# Patient Record
Sex: Male | Born: 1960 | Race: White | Hispanic: No | Marital: Married | Smoking: Never smoker
Health system: Southern US, Community
[De-identification: ages and names within clinical notes are randomized; demographics above are authoritative.]

## PROBLEM LIST (undated history)

## (undated) DIAGNOSIS — I10 Essential (primary) hypertension: Secondary | ICD-10-CM

## (undated) DIAGNOSIS — E782 Mixed hyperlipidemia: Secondary | ICD-10-CM

## (undated) DIAGNOSIS — E119 Type 2 diabetes mellitus without complications: Secondary | ICD-10-CM

## (undated) HISTORY — DX: Essential (primary) hypertension: I10

## (undated) HISTORY — PX: KNEE SURGERY: SHX244

## (undated) HISTORY — DX: Type 2 diabetes mellitus without complications: E11.9

## (undated) HISTORY — DX: Mixed hyperlipidemia: E78.2

## (undated) HISTORY — PX: EYE SURGERY: SHX253

---

## 2005-06-05 ENCOUNTER — Encounter: Admission: RE | Admit: 2005-06-05 | Discharge: 2005-06-05 | Payer: Self-pay | Admitting: Internal Medicine

## 2005-09-28 ENCOUNTER — Emergency Department (HOSPITAL_COMMUNITY): Admission: EM | Admit: 2005-09-28 | Discharge: 2005-09-29 | Payer: Self-pay | Admitting: Emergency Medicine

## 2007-09-20 IMAGING — CR DG ORBITS FOR FOREIGN BODY
2 series · 2 of 2 positions shown · non-contrast
Comparison: none

CLINICAL DATA: Pre MRI/metal exposure
 ORBITS FOR FOREIGN BODY - 2 VIEW:

[view not recorded (1 of 2)]
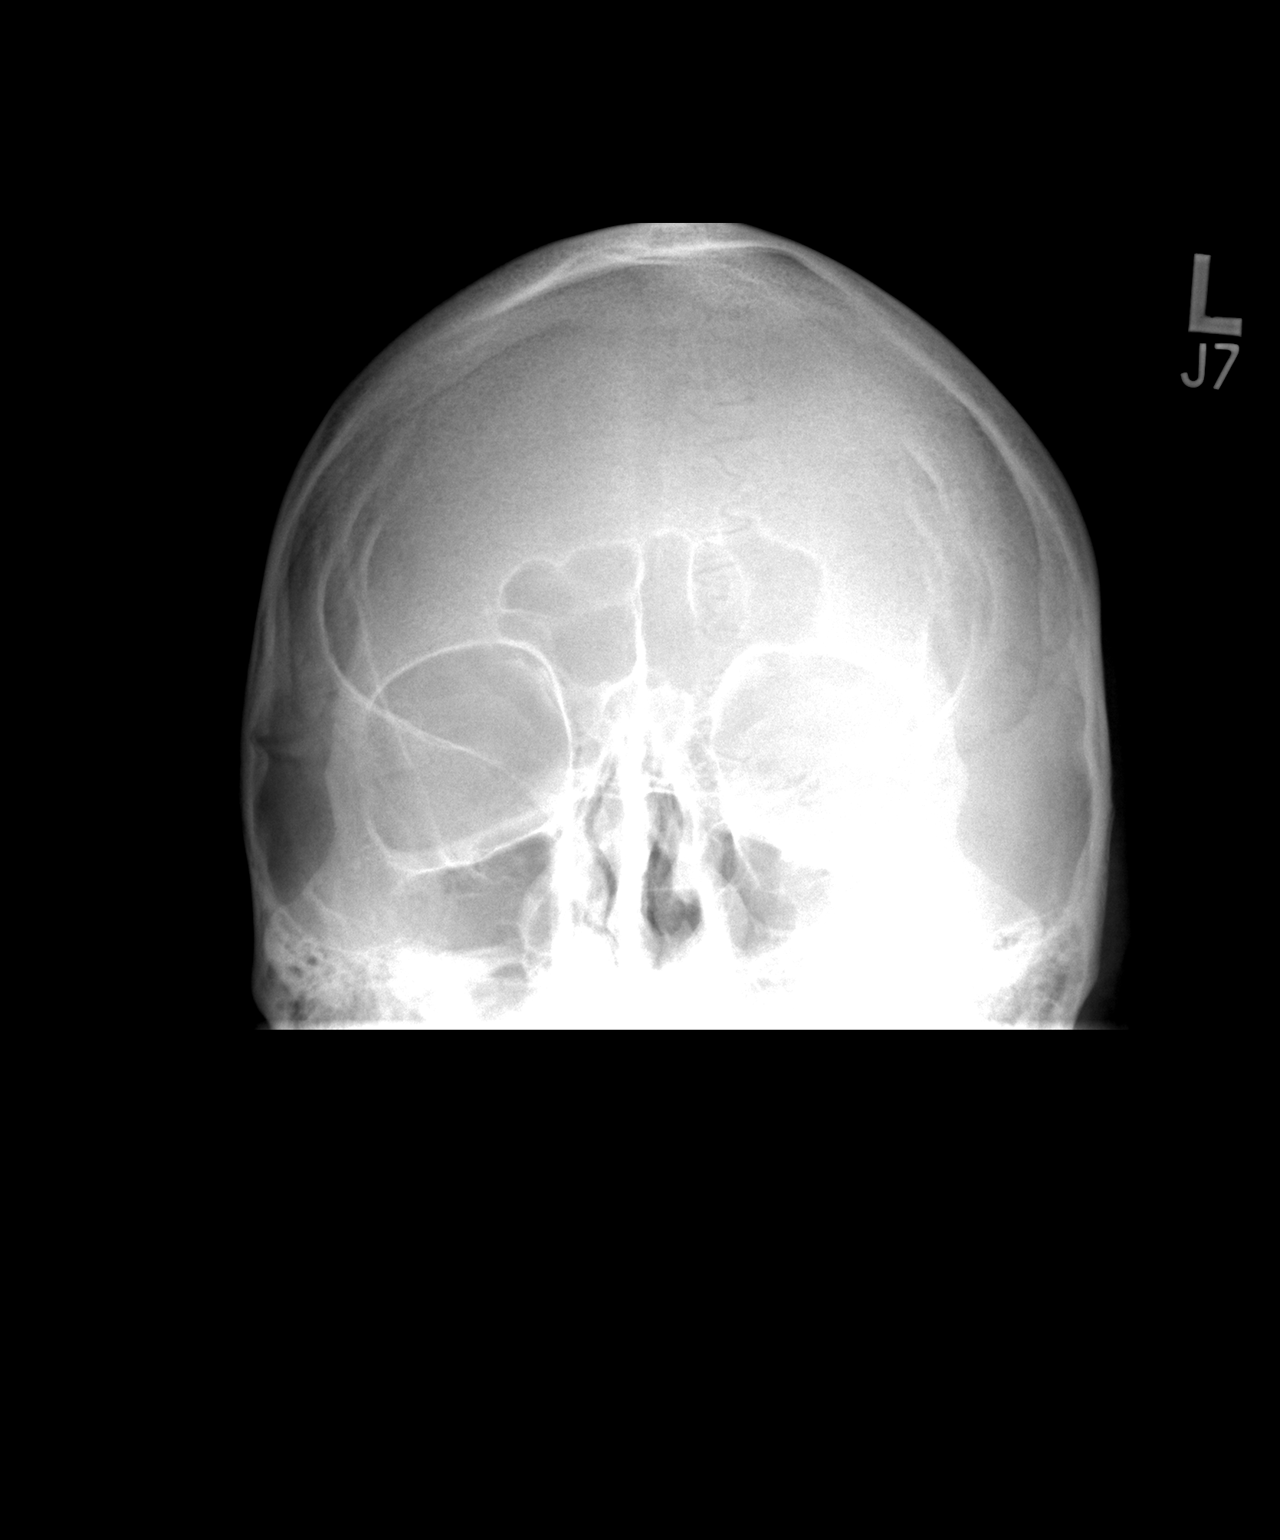

[view not recorded (2 of 2)]
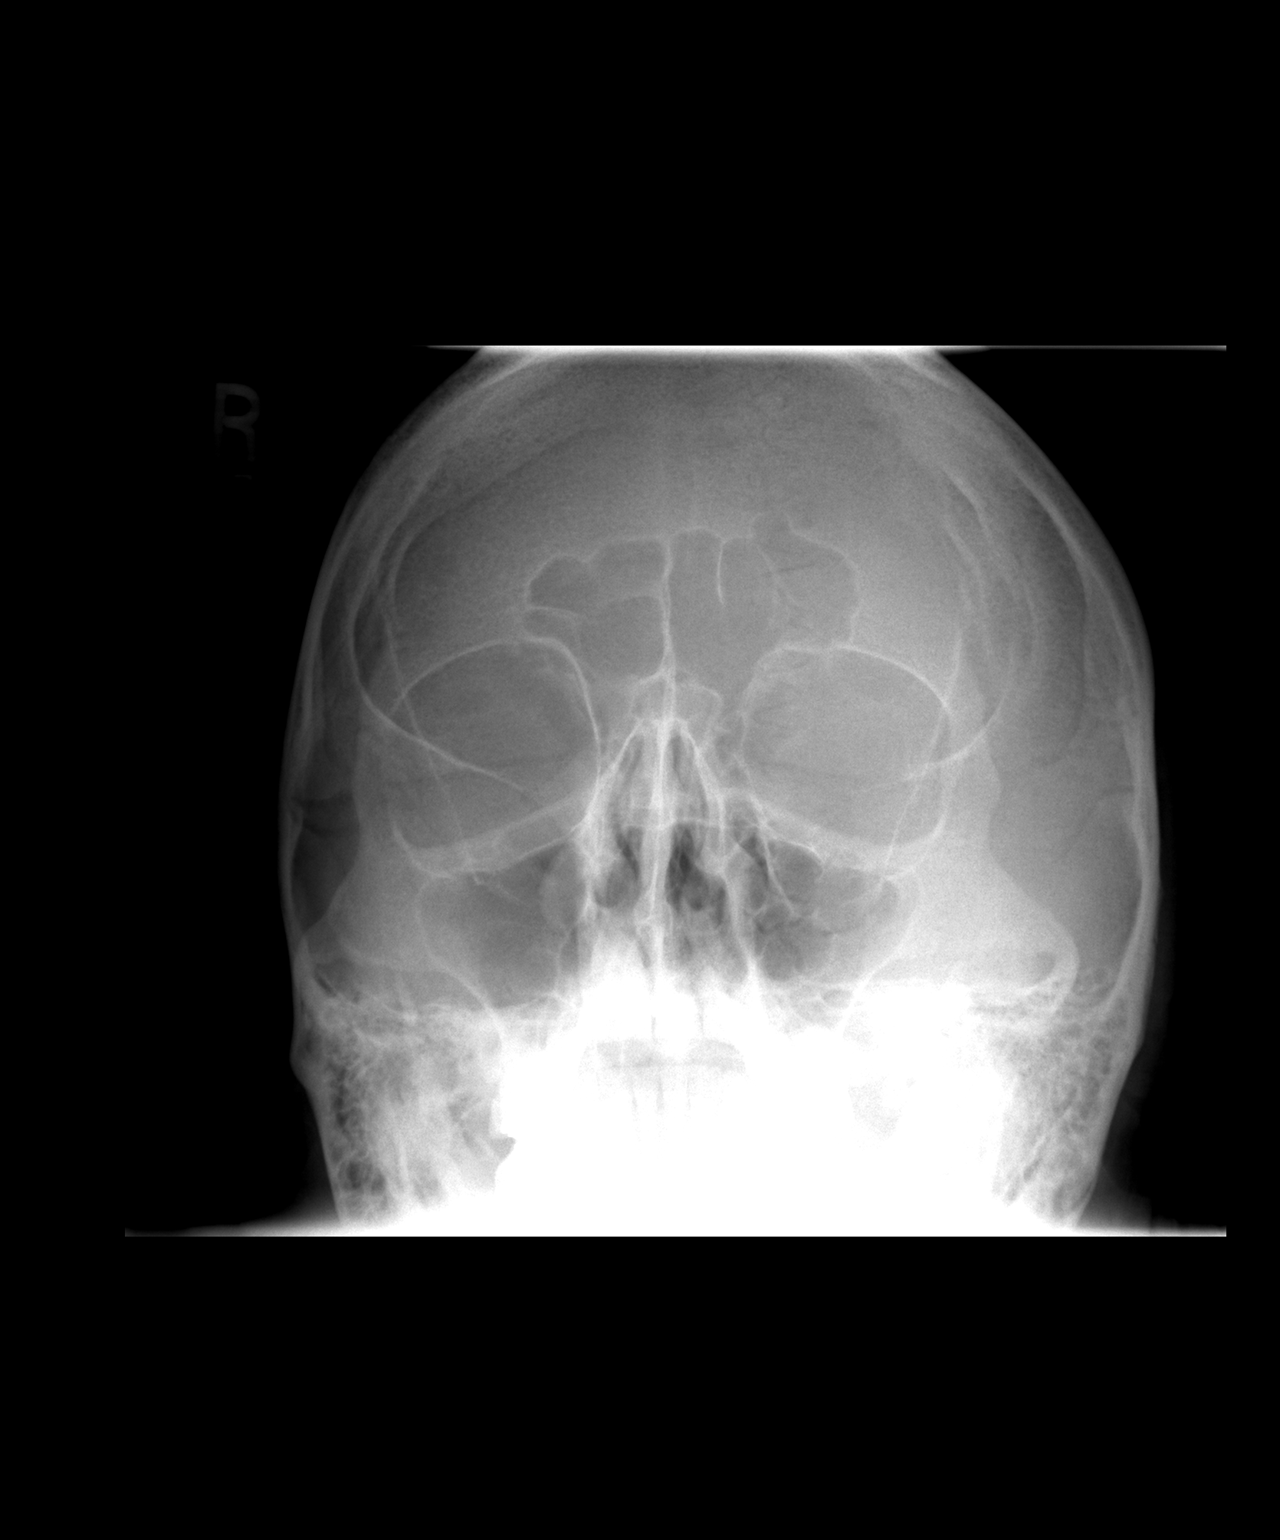

[2 of 2 positions shown; findings below may reference images not displayed]

FINDINGS: There is no evidence of metal or radiopaque foreign body within the orbits.   No significant bone or soft tissue abnormalities are identified.
IMPRESSION: No evidence of metallic foreign body.

## 2022-06-02 ENCOUNTER — Ambulatory Visit: Payer: No Typology Code available for payment source | Admitting: Cardiology

## 2022-06-02 ENCOUNTER — Encounter: Payer: Self-pay | Admitting: Cardiology

## 2022-06-02 VITALS — BP 150/88 | HR 74 | Resp 16 | Ht 70.0 in | Wt 261.0 lb

## 2022-06-02 DIAGNOSIS — E782 Mixed hyperlipidemia: Secondary | ICD-10-CM | POA: Insufficient documentation

## 2022-06-02 DIAGNOSIS — I2510 Atherosclerotic heart disease of native coronary artery without angina pectoris: Secondary | ICD-10-CM | POA: Insufficient documentation

## 2022-06-02 DIAGNOSIS — I10 Essential (primary) hypertension: Secondary | ICD-10-CM

## 2022-06-02 DIAGNOSIS — M7989 Other specified soft tissue disorders: Secondary | ICD-10-CM | POA: Insufficient documentation

## 2022-06-02 MED ORDER — ROSUVASTATIN CALCIUM 20 MG PO TABS: 20.0000 mg | ORAL_TABLET | Freq: Every day | ORAL | 3 refills | Status: DC

## 2022-06-02 NOTE — Progress Notes (Addendum)
Patient referred by Johny Blamer, MD for CAD screening Subjective:   Dustin Sanchez, male    DOB: [birth year], 61 y.o.   MRN: [id number]   Chief Complaint  Patient presents with   Coronary Artery Disease   Hypertension   Hyperlipidemia   New Patient (Initial Visit)     HPI  62 y.o. Caucasian male with hypertension, hyperlipidemia, type 2 DM  Patient recently moved from [facility] to [place].  He states that he "hibernate's" and winter months, and spends a lot of time on importance of maintenance.  He does not do any regular walking.  With his level of physical activity, he denies any chest pain or shortness of breath symptoms.  He tells me that several years ago, he had an episode of chest pain while in [place] that led to urgent care and then ER visit.  Subsequently, he underwent coronary angiography.  He tells me that they did find some "occlusion", but medical management was recommended.  Patient does not smoke.  He drinks 2-3 alcoholic drinks 2-3 times/week. Reviewed recent test results with the patient, details below.   He has no neuropathy.  He has noticed swelling in his right leg for last 2 to 3 months.    Past Medical History:  Diagnosis Date   Diabetes mellitus    Hypertension    Mixed hyperlipidemia      Past Surgical History:  Procedure Laterality Date   EYE SURGERY     KNEE SURGERY       Social History   Tobacco Use  Smoking Status Not on file  Smokeless Tobacco Not on file    Social History   Substance and Sexual Activity  Alcohol Use Not on file     Family History  Adopted: Yes      Current Outpatient Medications:    Alpha Lipoic Acid 200 MG CAPS, Take by mouth., Disp: , Rfl:    amLODipine (NORVASC) 10 MG tablet, Take 10 mg by mouth daily., Disp: , Rfl:    aspirin 81 MG chewable tablet, Chew 81 mg by mouth daily., Disp: , Rfl:    chlorthalidone (HYGROTON) 25 MG tablet, Take 25 mg by mouth daily., Disp: , Rfl:    cloNIDine  (CATAPRES) 0.1 MG tablet, Take 0.1 mg by mouth 2 (two) times daily., Disp: , Rfl:    cloNIDine (CATAPRES) 0.2 MG tablet, Take 0.2 mg by mouth 2 (two) times daily., Disp: , Rfl:    insulin glargine, 2 Unit Dial, (TOUJEO MAX SOLOSTAR) 300 UNIT/ML Solostar Pen, 190 units Subcutaneous once a day, Disp: , Rfl:    metoprolol succinate (TOPROL-XL) 100 MG 24 hr tablet, Take 1 tablet by mouth 2 (two) times daily., Disp: , Rfl:    ramipril (ALTACE) 10 MG capsule, Take 10 mg by mouth 2 (two) times daily., Disp: , Rfl:    rosuvastatin (CRESTOR) 10 MG tablet, Take 10 mg by mouth daily., Disp: , Rfl:    spironolactone (ALDACTONE) 50 MG tablet, Take 50 mg by mouth daily., Disp: , Rfl:    SYNJARDY XR 12.06-998 MG TB24, Take by mouth., Disp: , Rfl:    tadalafil (CIALIS) 20 MG tablet, Take 20 mg by mouth daily as needed., Disp: , Rfl:    Cardiovascular and other pertinent studies:  Reviewed external labs and tests, independently interpreted  EKG 06/02/2022: Sinus rhythm 71 bpm Normal EKG   Echocardiogram 2019: Technically difficult study due to chest Maue/lung interference.   Concentric left ventricular  remodeling with normal systolic function (EF  >55%) and grade III diastolic dysfunction suggesting significantly  elevated filling pressures.   No significant structural valvular abnormalities.   Moderately dilated left atrium.   Normal right ventricular systolic function.   Mildly dilated right atrium with elevated right atrial pressure.    Recent labs: 06/01/2022: HbA1C 9.4%  08/2021: Glucose 134, BUN/Cr 28/1.26. EGFR 65. Na/K 137/4.7. Rest of the CMP normal H/H 15/46. MCV 92. Platelets 186 HbA1C 9.8% Chol 87, TG 300, HDL 28, LDL calculated 16    Review of Systems  Cardiovascular:  Negative for chest pain, dyspnea on exertion, leg swelling, palpitations and syncope.         Vitals:   06/02/22 1053 06/02/22 1056  BP: (!) 156/88 (!) 150/88  Pulse: 74 74  Resp: 16   SpO2: 96%       Body mass index is 37.45 kg/m. Filed Weights   06/02/22 1053  Weight: 261 lb (118.4 kg)     Objective:   Physical Exam Vitals and nursing note reviewed.  Constitutional:      General: He is not in acute distress.    Appearance: He is obese.  Neck:     Vascular: No JVD.  Cardiovascular:     Rate and Rhythm: Normal rate and regular rhythm.     Heart sounds: Normal heart sounds. No murmur heard. Pulmonary:     Effort: Pulmonary effort is normal.     Breath sounds: Normal breath sounds. No wheezing or rales.  Musculoskeletal:        General: Swelling (Right leg) present.          Visit diagnoses:   ICD-10-CM   1. Essential hypertension  I10 EKG 12-Lead    PCV MYOCARDIAL PERFUSION WO LEXISCAN    2. Mixed hyperlipidemia  E78.2 EKG 12-Lead    PCV MYOCARDIAL PERFUSION WO LEXISCAN    Lipid panel    Direct LDL    Direct LDL    Lipid panel    3. Primary hypertension  I10 PCV ECHOCARDIOGRAM COMPLETE    4. Right leg swelling  M79.89 PCV LOWER VENOUS US (UNILATERAL)    5. Coronary artery disease involving native coronary artery of native heart without angina pectoris  I25.10 PCV MYOCARDIAL PERFUSION WO LEXISCAN       Orders Placed This Encounter  Procedures   Lipid panel   Direct LDL   PCV MYOCARDIAL PERFUSION WO LEXISCAN   EKG 12-Lead   PCV ECHOCARDIOGRAM COMPLETE   PCV LOWER VENOUS US (UNILATERAL)     Medication changes this visit: Medications Discontinued During This Encounter  Medication Reason   atorvastatin (LIPITOR) 10 MG tablet    rosuvastatin (CRESTOR) 10 MG tablet Reorder    Meds ordered this encounter  Medications   rosuvastatin (CRESTOR) 20 MG tablet    Sig: Take 1 tablet (20 mg total) by mouth daily.    Dispense:  90 tablet    Refill:  3     Assessment & Recommendations:   62 y.o. Caucasian male with hypertension, hyperlipidemia, type 2 DM  CAD screening: Known CAD. No angina symptoms. Encourage increase physical activity,  especially as his knee pain is now better.  Prior to starting exercise regimen, recommend exercise nuclear stress test and echocardiogram for cardiac risk stratification. Continue Aspirin, statin, Increase Crestor to 20 mg daily.  Hypertension: Uncontrolled.  Already on multiple agents.  I encouraged him to check home readings regularly.  If remains uncontrolled, may need to increase  dose of clonidine. Will check renal artery duplex.  Mixed hyperlipidemia: TG 300 in the setting of A1c 9.8%.  LDL likely falsely low. Discussed reducing alcohol intake and better diabetes control. Continue Aspirin, statin, Increase Crestor to 20 mg daily. Repeat fasting lipid panel and direct LDL in 3 months.  Right leg swelling: Check DVT study.   Thank you for referring the patient to [place]. Please feel free to contact with any questions.   Elder Negus, MD Pager: [phone] Office: [phone]

## 2022-06-09 LAB — LDL CHOLESTEROL, DIRECT: LDL Direct: 51 mg/dL (ref 0–99)

## 2022-06-09 LAB — LIPID PANEL
Chol/HDL Ratio: 3.2 ratio (ref 0.0–5.0)
Cholesterol, Total: 116 mg/dL (ref 100–199)
HDL: 36 mg/dL — ABNORMAL LOW (ref >39)
LDL Chol Calc (NIH): 45 mg/dL (ref 0–99)
Triglycerides: 216 mg/dL — ABNORMAL HIGH (ref 0–149)
VLDL Cholesterol Cal: 35 mg/dL (ref 5–40)

## 2022-06-19 ENCOUNTER — Ambulatory Visit: Payer: No Typology Code available for payment source

## 2022-06-19 DIAGNOSIS — E782 Mixed hyperlipidemia: Secondary | ICD-10-CM

## 2022-06-19 DIAGNOSIS — I2510 Atherosclerotic heart disease of native coronary artery without angina pectoris: Secondary | ICD-10-CM

## 2022-06-19 DIAGNOSIS — I10 Essential (primary) hypertension: Secondary | ICD-10-CM

## 2022-06-28 LAB — PCV MYOCARDIAL PERFUSION WO LEXISCAN: ST Depression (mm): 0 mm

## 2022-06-30 ENCOUNTER — Ambulatory Visit: Payer: No Typology Code available for payment source

## 2022-06-30 DIAGNOSIS — I10 Essential (primary) hypertension: Secondary | ICD-10-CM

## 2022-07-05 ENCOUNTER — Ambulatory Visit: Payer: No Typology Code available for payment source

## 2022-07-05 DIAGNOSIS — M7989 Other specified soft tissue disorders: Secondary | ICD-10-CM

## 2022-09-06 ENCOUNTER — Ambulatory Visit: Payer: No Typology Code available for payment source | Admitting: Cardiology

## 2022-09-08 ENCOUNTER — Ambulatory Visit: Payer: No Typology Code available for payment source | Admitting: Cardiology

## 2022-09-08 ENCOUNTER — Encounter: Payer: Self-pay | Admitting: Cardiology

## 2022-09-08 VITALS — BP 126/80 | HR 71 | Resp 16 | Ht 70.0 in | Wt 263.6 lb

## 2022-09-08 DIAGNOSIS — I77810 Thoracic aortic ectasia: Secondary | ICD-10-CM | POA: Insufficient documentation

## 2022-09-08 DIAGNOSIS — E782 Mixed hyperlipidemia: Secondary | ICD-10-CM

## 2022-09-08 DIAGNOSIS — I10 Essential (primary) hypertension: Secondary | ICD-10-CM

## 2022-09-08 NOTE — Progress Notes (Signed)
Patient referred by Noberto Retort, MD for CAD screening Subjective:   Orlene Erm, male    DOB: 1960-05-30, 62 y.o.   MRN: [id number]   Chief Complaint  Patient presents with   Coronary Artery Disease   Follow-up     HPI  62 y.o. Caucasian male with hypertension, hyperlipidemia, type 2 DM  Patient has had some post nasal discharge lately, but otherwise doing well, denies chest pain, shortness of breath, palpitations, leg edema, orthopnea, PND, TIA/syncope. Reviewed recent test results with the patient, details below.     Initial consultation visit 05/2022: Patient recently moved from [facility] to [place].  He states that he "hibernate's" and winter months, and spends a lot of time on importance of maintenance.  He does not do any regular walking.  With his level of physical activity, he denies any chest pain or shortness of breath symptoms.  He tells me that several years ago, he had an episode of chest pain while in [place] that led to urgent care and then ER visit.  Subsequently, he underwent coronary angiography.  He tells me that they did find some "occlusion", but medical management was recommended.  Patient does not smoke.  He drinks 2-3 alcoholic drinks 2-3 times/week. Reviewed recent test results with the patient, details below.   He has no neuropathy.  He has noticed swelling in his right leg for last 2 to 3 months.    Current Outpatient Medications:    Alpha Lipoic Acid 200 MG CAPS, Take by mouth., Disp: , Rfl:    amLODipine (NORVASC) 10 MG tablet, Take 10 mg by mouth daily., Disp: , Rfl:    aspirin 81 MG chewable tablet, Chew 81 mg by mouth daily., Disp: , Rfl:    chlorthalidone (HYGROTON) 25 MG tablet, Take 25 mg by mouth daily., Disp: , Rfl:    cloNIDine (CATAPRES) 0.1 MG tablet, Take 0.1 mg by mouth 2 (two) times daily., Disp: , Rfl:    cloNIDine (CATAPRES) 0.2 MG tablet, Take 0.2 mg by mouth 2 (two) times daily., Disp: , Rfl:    insulin glargine, 2  Unit Dial, (TOUJEO MAX SOLOSTAR) 300 UNIT/ML Solostar Pen, 190 units Subcutaneous once a day, Disp: , Rfl:    metoprolol succinate (TOPROL-XL) 100 MG 24 hr tablet, Take 1 tablet by mouth 2 (two) times daily., Disp: , Rfl:    ramipril (ALTACE) 10 MG capsule, Take 10 mg by mouth 2 (two) times daily., Disp: , Rfl:    rosuvastatin (CRESTOR) 20 MG tablet, Take 1 tablet (20 mg total) by mouth daily., Disp: 90 tablet, Rfl: 3   spironolactone (ALDACTONE) 50 MG tablet, Take 50 mg by mouth daily., Disp: , Rfl:    SYNJARDY XR 12.06-998 MG TB24, Take by mouth., Disp: , Rfl:    tadalafil (CIALIS) 20 MG tablet, Take 20 mg by mouth daily as needed., Disp: , Rfl:    Cardiovascular and other pertinent studies:  Reviewed external labs and tests, independently interpreted  EKG 06/02/2022: Sinus rhythm 71 bpm Normal EKG  Echocardiogram 06/30/2022:  Normal LV systolic function with visual EF 55-60%. Left ventricle cavity  is normal in size. Mild concentric hypertrophy of the left ventricle.  Normal global Shutter motion. Normal diastolic filling pattern, normal LAP.  Calculated EF 53%.  Structurally normal tricuspid valve with trace regurgitation. No evidence  of pulmonary hypertension.  The aortic root is normal. Mildly dilated ascending aorta at 4.1 cm.  IVC is dilated with respiratory variation.  No prior available for comparison.   Regadenoson (with Mod Bruce protocol) Nuclear stress test 06/19/2022: Normal myocardial perfusion.   LV rest volume 101 ml.  LV stress volume 86 ml. TID ratio 0.85, which is normal. Stress LVEF calculated 48% but visually appear normal. Lexiscan/modified Bruce nuclear stress test performed using 1-day protocol. Patient walked for 5 min 13 sec, achieved 5.9 METS and reached 62% MPHR. In addition, patient was injected with 0.4 mg Lexiscan. Stress EKG is non-diagnostic, as this is pharmacological stress test. Low risk study.    Recent labs: 06/01/2022: Chol 116, TG 216, HDL 36,  LDL 45 HbA1C 9.4%  08/2021: Glucose 134, BUN/Cr 28/1.26. EGFR 65. Na/K 137/4.7. Rest of the CMP normal H/H 15/46. MCV 92. Platelets 186    Review of Systems  Cardiovascular:  Negative for chest pain, dyspnea on exertion, leg swelling, palpitations and syncope.         Vitals:   09/08/22 0857  BP: 126/80  Pulse: 71  Resp: 16  SpO2: 97%      Body mass index is 37.82 kg/m. Filed Weights   09/08/22 0857  Weight: 263 lb 9.6 oz (119.6 kg)      Objective:   Physical Exam Vitals and nursing note reviewed.  Constitutional:      General: He is not in acute distress.    Appearance: He is obese.  Neck:     Vascular: No JVD.  Cardiovascular:     Rate and Rhythm: Normal rate and regular rhythm.     Heart sounds: Normal heart sounds. No murmur heard. Pulmonary:     Effort: Pulmonary effort is normal.     Breath sounds: Normal breath sounds. No wheezing or rales.  Musculoskeletal:        General: No swelling.          Visit diagnoses:   ICD-10-CM   1. Ascending aorta dilation (HCC)  I77.810 PCV ECHOCARDIOGRAM COMPLETE    2. Essential hypertension  I10     3. Mixed hyperlipidemia  E78.2         Orders Placed This Encounter  Procedures   PCV ECHOCARDIOGRAM COMPLETE     Medication changes this visit: Medications Discontinued During This Encounter  Medication Reason   aspirin 81 MG chewable tablet Discontinued by provider       Assessment & Recommendations:   62 y.o. Caucasian male with hypertension, hyperlipidemia, type 2 DM  CAD screening: No ischemia on stress testing (29562). Okay to discontinue Aspirin. Continue Crestor 20 mg daily. Lipids well controlled.   Aortic root dilatation: Mild 4.1 cm. Repeat echocardiogram in 1 year. Avoid lifting heavy objects. Continue ramipril, metoprolol.  Hypertension: No RAS. Now well controlled.  Mixed hyperlipidemia: Chol 116, TG 216, HDL 36, LDL 45 (05/2022). Continue Crestor to 20 mg  daily.  Right leg swelling: No DVT.  F/u in 1 year    Elder Negus, MD Pager: [phone] Office: [phone]

## 2023-02-22 ENCOUNTER — Other Ambulatory Visit: Payer: Self-pay

## 2023-02-22 MED ORDER — ROSUVASTATIN CALCIUM 20 MG PO TABS: 20.0000 mg | ORAL_TABLET | Freq: Every day | ORAL | 1 refills | Status: DC

## 2023-07-30 ENCOUNTER — Ambulatory Visit (HOSPITAL_COMMUNITY)
Admission: RE | Admit: 2023-07-30 | Discharge: 2023-07-30 | Disposition: A | Discharge status: Home / Self Care | Payer: Self-pay | Source: Ambulatory Visit | Attending: Cardiovascular Disease | Admitting: Cardiovascular Disease

## 2023-07-30 DIAGNOSIS — I10 Essential (primary) hypertension: Secondary | ICD-10-CM

## 2023-07-30 DIAGNOSIS — I77810 Thoracic aortic ectasia: Secondary | ICD-10-CM | POA: Diagnosis not present

## 2023-07-30 LAB — ECHOCARDIOGRAM COMPLETE
Area-P 1/2: 4.6 cm2
S' Lateral: 2.7 cm

## 2023-07-31 ENCOUNTER — Ambulatory Visit: Payer: Self-pay | Admitting: Cardiology

## 2023-07-31 DIAGNOSIS — I77810 Thoracic aortic ectasia: Secondary | ICD-10-CM

## 2023-07-31 NOTE — Progress Notes (Signed)
 Stable. Unchanged, mild aorta dilatation. Reasonable to repeat echocardiogram in 2 years.  Thanks MJP

## 2023-11-13 ENCOUNTER — Other Ambulatory Visit: Payer: Self-pay | Admitting: Cardiology

## 2024-01-12 ENCOUNTER — Other Ambulatory Visit: Payer: Self-pay | Admitting: Cardiology
